# Patient Record
Sex: Female | Born: 2000 | Race: White | Hispanic: No | Marital: Single | Smoking: Never smoker
Health system: Southern US, Community
[De-identification: ages and names within clinical notes are randomized; demographics above are authoritative.]

## PROBLEM LIST (undated history)

## (undated) HISTORY — PX: OTHER SURGICAL HISTORY: SHX169

---
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
## 2020-04-25 ENCOUNTER — Other Ambulatory Visit: Payer: 59

## 2022-02-08 DIAGNOSIS — Z23 Encounter for immunization: Secondary | ICD-10-CM | POA: Diagnosis not present

## 2022-03-14 DIAGNOSIS — Z01419 Encounter for gynecological examination (general) (routine) without abnormal findings: Secondary | ICD-10-CM | POA: Diagnosis not present

## 2022-03-14 DIAGNOSIS — Z6823 Body mass index (BMI) 23.0-23.9, adult: Secondary | ICD-10-CM | POA: Diagnosis not present

## 2022-03-14 DIAGNOSIS — Z30431 Encounter for routine checking of intrauterine contraceptive device: Secondary | ICD-10-CM | POA: Diagnosis not present

## 2022-03-14 DIAGNOSIS — Z113 Encounter for screening for infections with a predominantly sexual mode of transmission: Secondary | ICD-10-CM | POA: Diagnosis not present

## 2022-03-14 DIAGNOSIS — Z124 Encounter for screening for malignant neoplasm of cervix: Secondary | ICD-10-CM | POA: Diagnosis not present

## 2022-03-14 DIAGNOSIS — R69 Illness, unspecified: Secondary | ICD-10-CM | POA: Diagnosis not present

## 2022-06-11 ENCOUNTER — Encounter: Admit: 2022-06-11 | Payer: PRIVATE HEALTH INSURANCE | Primary: Family

## 2022-06-12 ENCOUNTER — Inpatient Hospital Stay: Admit: 2022-06-12 | Discharge: 2022-06-12 | Payer: PRIVATE HEALTH INSURANCE | Primary: Family

## 2022-06-12 DIAGNOSIS — Z006 Encounter for examination for normal comparison and control in clinical research program: Secondary | ICD-10-CM

## 2022-06-12 NOTE — Research Notes
[id number] Blood specimens were obtained by venipuncture (right arm) or via IV access as per protocol at 1205 on 06/12/2022 All tubes in kit were drawn. These blood specimen tubes were immediately transferred to the research study team member as per protocol.Participant tolerated the procedures well.Electronically Signed by Francisco Capuchin, RN, June 12, 2022

## 2023-02-07 ENCOUNTER — Encounter: Admit: 2023-02-07 | Payer: PRIVATE HEALTH INSURANCE | Attending: Family Medicine | Primary: Family

## 2023-02-08 ENCOUNTER — Encounter: Admit: 2023-02-08 | Payer: PRIVATE HEALTH INSURANCE | Primary: Family

## 2023-02-11 ENCOUNTER — Inpatient Hospital Stay: Admit: 2023-02-11 | Discharge: 2023-02-11 | Payer: PRIVATE HEALTH INSURANCE | Primary: Family

## 2023-02-11 DIAGNOSIS — Z006 Encounter for examination for normal comparison and control in clinical research program: Secondary | ICD-10-CM

## 2023-03-13 ENCOUNTER — Encounter: Admit: 2023-03-13 | Payer: PRIVATE HEALTH INSURANCE | Primary: Family

## 2023-03-14 ENCOUNTER — Inpatient Hospital Stay: Admit: 2023-03-14 | Discharge: 2023-03-14 | Payer: PRIVATE HEALTH INSURANCE | Primary: Family

## 2023-03-14 DIAGNOSIS — Z006 Encounter for examination for normal comparison and control in clinical research program: Secondary | ICD-10-CM

## 2023-03-19 DIAGNOSIS — Z6823 Body mass index (BMI) 23.0-23.9, adult: Secondary | ICD-10-CM | POA: Diagnosis not present

## 2023-03-19 DIAGNOSIS — N921 Excessive and frequent menstruation with irregular cycle: Secondary | ICD-10-CM | POA: Diagnosis not present

## 2023-03-19 DIAGNOSIS — Z01419 Encounter for gynecological examination (general) (routine) without abnormal findings: Secondary | ICD-10-CM | POA: Diagnosis not present

## 2023-03-19 DIAGNOSIS — Z30431 Encounter for routine checking of intrauterine contraceptive device: Secondary | ICD-10-CM | POA: Diagnosis not present

## 2023-03-19 DIAGNOSIS — Z113 Encounter for screening for infections with a predominantly sexual mode of transmission: Secondary | ICD-10-CM | POA: Diagnosis not present

## 2023-04-29 DIAGNOSIS — R58 Hemorrhage, not elsewhere classified: Secondary | ICD-10-CM | POA: Diagnosis not present

## 2023-04-29 DIAGNOSIS — Z3202 Encounter for pregnancy test, result negative: Secondary | ICD-10-CM | POA: Diagnosis not present

## 2023-04-29 DIAGNOSIS — Z30433 Encounter for removal and reinsertion of intrauterine contraceptive device: Secondary | ICD-10-CM | POA: Diagnosis not present

## 2023-05-21 ENCOUNTER — Encounter: Admit: 2023-05-21 | Payer: PRIVATE HEALTH INSURANCE | Primary: Family

## 2023-05-23 ENCOUNTER — Inpatient Hospital Stay: Admit: 2023-05-23 | Discharge: 2023-05-23 | Payer: PRIVATE HEALTH INSURANCE | Primary: Family

## 2023-05-23 DIAGNOSIS — Z006 Encounter for examination for normal comparison and control in clinical research program: Secondary | ICD-10-CM

## 2023-07-17 ENCOUNTER — Encounter: Admit: 2023-07-17 | Payer: PRIVATE HEALTH INSURANCE | Primary: Family

## 2023-07-23 ENCOUNTER — Inpatient Hospital Stay: Admit: 2023-07-23 | Discharge: 2023-07-23 | Payer: PRIVATE HEALTH INSURANCE | Primary: Family

## 2023-08-12 ENCOUNTER — Inpatient Hospital Stay: Admit: 2023-08-12 | Payer: PRIVATE HEALTH INSURANCE | Primary: Family

## 2023-08-13 ENCOUNTER — Inpatient Hospital Stay: Admit: 2023-08-13 | Discharge: 2023-08-13 | Payer: Managed Care (Private) | Primary: Family

## 2023-08-13 DIAGNOSIS — Z006 Encounter for examination for normal comparison and control in clinical research program: Secondary | ICD-10-CM

## 2023-08-13 NOTE — Research Notes
 [id number] Visit  - Day Blood specimens were obtained by venipuncture (left arm access as per protocol at 1520 on 08/13/2023 All tubes in kit were drawn. These blood specimen tubes were immediately transferred to the research study team member as per protocol.Participant tolerated the procedures well.Electronically Signed by Scotty Cyphers, August 13, 2023
